# Patient Record
Sex: Female | Born: 1945 | Race: White | Hispanic: No | Marital: Married | Smoking: Current every day smoker
Health system: Southern US, Community
[De-identification: ages and names within clinical notes are randomized; demographics above are authoritative.]

---

## 2004-03-08 ENCOUNTER — Emergency Department (HOSPITAL_COMMUNITY): Admission: EM | Admit: 2004-03-08 | Discharge: 2004-03-08 | Payer: Self-pay | Admitting: Emergency Medicine

## 2015-08-07 ENCOUNTER — Encounter (HOSPITAL_COMMUNITY): Payer: Self-pay | Admitting: *Deleted

## 2015-08-07 ENCOUNTER — Emergency Department (HOSPITAL_COMMUNITY)
Admission: EM | Admit: 2015-08-07 | Discharge: 2015-08-07 | Disposition: A | Discharge status: Home / Self Care | Payer: PPO | Attending: Emergency Medicine | Admitting: Emergency Medicine

## 2015-08-07 ENCOUNTER — Emergency Department (HOSPITAL_COMMUNITY): Payer: PPO

## 2015-08-07 DIAGNOSIS — Z880 Allergy status to penicillin: Secondary | ICD-10-CM | POA: Diagnosis not present

## 2015-08-07 DIAGNOSIS — F17210 Nicotine dependence, cigarettes, uncomplicated: Secondary | ICD-10-CM | POA: Diagnosis not present

## 2015-08-07 DIAGNOSIS — J189 Pneumonia, unspecified organism: Secondary | ICD-10-CM | POA: Diagnosis not present

## 2015-08-07 DIAGNOSIS — J159 Unspecified bacterial pneumonia: Secondary | ICD-10-CM | POA: Diagnosis not present

## 2015-08-07 DIAGNOSIS — R0602 Shortness of breath: Secondary | ICD-10-CM | POA: Diagnosis not present

## 2015-08-07 DIAGNOSIS — R000 Tachycardia, unspecified: Secondary | ICD-10-CM | POA: Diagnosis not present

## 2015-08-07 DIAGNOSIS — F17200 Nicotine dependence, unspecified, uncomplicated: Secondary | ICD-10-CM | POA: Insufficient documentation

## 2015-08-07 LAB — CBC WITH DIFFERENTIAL/PLATELET
BASOS ABS: 0 10*3/uL (ref 0.0–0.1)
Basophils Relative: 0 %
EOS ABS: 0 10*3/uL (ref 0.0–0.7)
Eosinophils Relative: 0 %
HCT: 44.7 % (ref 36.0–46.0)
HEMOGLOBIN: 15.1 g/dL — AB (ref 12.0–15.0)
LYMPHS ABS: 2.8 10*3/uL (ref 0.7–4.0)
Lymphocytes Relative: 19 %
MCH: 30.1 pg (ref 26.0–34.0)
MCHC: 33.8 g/dL (ref 30.0–36.0)
MCV: 89.2 fL (ref 78.0–100.0)
Monocytes Absolute: 0.8 10*3/uL (ref 0.1–1.0)
Monocytes Relative: 5 %
NEUTROS PCT: 76 %
Neutro Abs: 11.5 10*3/uL — ABNORMAL HIGH (ref 1.7–7.7)
Platelets: 521 10*3/uL — ABNORMAL HIGH (ref 150–400)
RBC: 5.01 MIL/uL (ref 3.87–5.11)
RDW: 12.7 % (ref 11.5–15.5)
WBC: 15.1 10*3/uL — AB (ref 4.0–10.5)

## 2015-08-07 LAB — BASIC METABOLIC PANEL
ANION GAP: 10 (ref 5–15)
BUN: 11 mg/dL (ref 6–20)
CHLORIDE: 102 mmol/L (ref 101–111)
CO2: 29 mmol/L (ref 22–32)
Calcium: 9.1 mg/dL (ref 8.9–10.3)
Creatinine, Ser: 0.63 mg/dL (ref 0.44–1.00)
Glucose, Bld: 135 mg/dL — ABNORMAL HIGH (ref 65–99)
POTASSIUM: 3.7 mmol/L (ref 3.5–5.1)
SODIUM: 141 mmol/L (ref 135–145)

## 2015-08-07 MED ORDER — LEVOFLOXACIN 500 MG PO TABS
500.0000 mg | ORAL_TABLET | Freq: Once | ORAL | Status: AC
  Administered 2015-08-07: 500 mg via ORAL
  Filled 2015-08-07: qty 1

## 2015-08-07 MED ORDER — CHLORPHENIRAMINE-PHENYLEPHRINE 1-3.5 MG/ML PO LIQD: 0.7500 mL | Freq: Four times a day (QID) | ORAL | Status: DC | PRN

## 2015-08-07 MED ORDER — LEVOFLOXACIN 500 MG PO TABS
500.0000 mg | ORAL_TABLET | Freq: Every day | ORAL | Status: DC
Start: 2015-08-07 — End: 2015-11-14

## 2015-08-07 NOTE — ED Notes (Signed)
MD at bedside. 

## 2015-08-07 NOTE — ED Notes (Signed)
Pt with cough and congestion for about a week, went to see PCP at [facility] and they would not see her, denies fever or N/v/D

## 2015-08-07 NOTE — Discharge Instructions (Signed)
Community-Acquired Pneumonia, Adult Pneumonia is an infection of the lungs. One type of pneumonia can happen while a person is in a hospital. A different type can happen when a person is not in a hospital (community-acquired pneumonia). It is easy for this kind to spread from person to person. It can spread to you if you breathe near an infected person who coughs or sneezes. Some symptoms include:  A dry cough.  A wet (productive) cough.  Fever.  Sweating.  Chest pain. HOME CARE  Take over-the-counter and prescription medicines only as told by your doctor.  Only take cough medicine if you are losing sleep.  If you were prescribed an antibiotic medicine, take it as told by your doctor. Do not stop taking the antibiotic even if you start to feel better.  Sleep with your head and neck raised (elevated). You can do this by putting a few pillows under your head, or you can sleep in a recliner.  Do not use tobacco products. These include cigarettes, chewing tobacco, and e-cigarettes. If you need help quitting, ask your doctor.  Drink enough water to keep your pee (urine) clear or pale yellow. A shot (vaccine) can help prevent pneumonia. Shots are often suggested for:  People older than 71 years of age.  People older than 71 years of age:  Who are having cancer treatment.  Who have long-term (chronic) lung disease.  Who have problems with their body's defense system (immune system). You may also prevent pneumonia if you take these actions:  Get the flu (influenza) shot every year.  Go to the dentist as often as told.  Wash your hands often. If soap and water are not available, use hand sanitizer. GET HELP IF:  You have a fever.  You lose sleep because your cough medicine does not help. GET HELP RIGHT AWAY IF:  You are short of breath and it gets worse.  You have more chest pain.  Your sickness gets worse. This is very serious if:  You are an older adult.  Your  body's defense system is weak.  You cough up blood.   This information is not intended to replace advice given to you by your health care provider. Make sure you discuss any questions you have with your health care provider.   Document Released: 12/30/2007 Document Revised: 04/03/2015 Document Reviewed: 11/07/2014 Elsevier Interactive Patient Education 2016 [employer]   SMOKING CESSATION HELP:  Dont use tobacco products: Call [phone] for an appointment to help you quit using tobacco

## 2015-08-07 NOTE — ED Provider Notes (Signed)
CSN: [id number]     Arrival date & time 08/07/15  1343 History   First MD Initiated Contact with Patient 08/07/15 1401     Chief Complaint  Patient presents with  . Shortness of Breath     (Consider location/radiation/quality/duration/timing/severity/associated sxs/prior Treatment) HPI She presents with concern of cough, dyspnea, congestion. Patient has had intermittent neck discomfort as well, though currently no neck pain. Symptoms began about one week ago. No relief with Mucinex. Patient was well prior to the onset of symptoms, since onset symptoms have been progressive, with generalized fatigue, but no focal pain, no fever, chills, no nausea, vomiting, diarrhea. Patient smokes, denies other medical problems.   Smoking cessation provided, particularly in light of this patient's evaluation in the ED.  History reviewed. No pertinent past medical history. History reviewed. No pertinent past surgical history. History reviewed. No pertinent family history. Social History  Substance Use Topics  . Smoking status: Current Every Day Smoker    Types: Cigarettes  . Smokeless tobacco: None  . Alcohol Use: No   OB History    No data available     Review of Systems  Constitutional:       Per HPI, otherwise negative  HENT:       Per HPI, otherwise negative  Respiratory:       Per HPI, otherwise negative  Cardiovascular:       Per HPI, otherwise negative  Gastrointestinal: Negative for vomiting.  Endocrine:       Negative aside from HPI  Genitourinary:       Neg aside from HPI   Musculoskeletal:       Per HPI, otherwise negative  Skin: Negative.   Neurological: Negative for syncope.      Allergies  Penicillins  Home Medications   Prior to Admission medications   Not on File   BP 158/90 mmHg  Pulse 110  Temp(Src) 97.9 F (36.6 C) (Temporal)  Resp 18  Ht 5\' 3"  (1.6 m)  Wt 130 lb (58.968 kg)  BMI 23.03 kg/m2  SpO2 99% Physical Exam  Constitutional: She is  oriented to person, place, and time. She appears well-developed and well-nourished. No distress.  Obvious order of cigarette smoke  HENT:  Head: Normocephalic and atraumatic.  Eyes: Conjunctivae and EOM are normal.  Cardiovascular: Regular rhythm.  Tachycardia present.   Pulmonary/Chest: Effort normal. No stridor. No respiratory distress.  Diminished breath sounds, no wheezing  Abdominal: She exhibits no distension.  Musculoskeletal: She exhibits no edema.  Neurological: She is alert and oriented to person, place, and time. No cranial nerve deficit.  Skin: Skin is warm and dry.  Psychiatric: She has a normal mood and affect.  Nursing note and vitals reviewed.   ED Course  Procedures (including critical care time) Labs Review Labs Reviewed  CBC WITH DIFFERENTIAL/PLATELET - Abnormal; Notable for the following:    WBC 15.1 (*)    Hemoglobin 15.1 (*)    Platelets 521 (*)    Neutro Abs 11.5 (*)    All other components within normal limits  BASIC METABOLIC PANEL    Imaging Review Dg Chest 2 View  08/07/2015  CLINICAL DATA:  Shortness of breath and hoarseness beginning yesterday. Smoker. EXAM: CHEST  2 VIEW COMPARISON:  None. FINDINGS: Mild airspace disease is seen in the posterior left lower lobe, suspicious for pneumonia. Right lung is clear. No evidence of pneumothorax or pleural effusion. Heart size and mediastinal contours are within normal limits. Pulmonary hyperinflation is suspicious for  COPD. IMPRESSION: Mild posterior left lower lobe airspace disease, suspicious for pneumonia. Followup PA and lateral chest X-ray is recommended in 3-4 weeks following trial of antibiotic therapy to ensure resolution and exclude underlying malignancy. Probable COPD. Electronically Signed   By: Myles RosenthalJohn  Stahl M.D.   On: 08/07/2015 14:21   I have personally reviewed and evaluated these images and lab results as part of my medical decision-making.  On repeat exam the patient is in no distress.  We  discussed all findings at length, including presumptive pneumonia, the need to follow-up with primary care.  MDM  Elderly smoker presents with cough, congestion, here is tachycardic, otherwise hemodynamically stable, awake, alert, in no distress. Patient started on a course of antibiotics, discharged in stable condition to follow-up with primary care.  Gerhard Munchobert Sisto Granillo, MD 08/07/15 [phone]

## 2015-08-15 ENCOUNTER — Emergency Department (HOSPITAL_COMMUNITY)
Admission: EM | Admit: 2015-08-15 | Discharge: 2015-08-15 | Disposition: A | Discharge status: Home / Self Care | Payer: PPO | Attending: Emergency Medicine | Admitting: Emergency Medicine

## 2015-08-15 ENCOUNTER — Encounter (HOSPITAL_COMMUNITY): Payer: Self-pay | Admitting: *Deleted

## 2015-08-15 DIAGNOSIS — J159 Unspecified bacterial pneumonia: Secondary | ICD-10-CM | POA: Diagnosis not present

## 2015-08-15 DIAGNOSIS — Z09 Encounter for follow-up examination after completed treatment for conditions other than malignant neoplasm: Secondary | ICD-10-CM | POA: Diagnosis not present

## 2015-08-15 DIAGNOSIS — Z880 Allergy status to penicillin: Secondary | ICD-10-CM | POA: Insufficient documentation

## 2015-08-15 DIAGNOSIS — F17210 Nicotine dependence, cigarettes, uncomplicated: Secondary | ICD-10-CM | POA: Insufficient documentation

## 2015-08-15 DIAGNOSIS — R000 Tachycardia, unspecified: Secondary | ICD-10-CM | POA: Diagnosis not present

## 2015-08-15 DIAGNOSIS — Z792 Long term (current) use of antibiotics: Secondary | ICD-10-CM | POA: Diagnosis not present

## 2015-08-15 DIAGNOSIS — J189 Pneumonia, unspecified organism: Secondary | ICD-10-CM | POA: Diagnosis not present

## 2015-08-15 DIAGNOSIS — R05 Cough: Secondary | ICD-10-CM | POA: Diagnosis not present

## 2015-08-15 NOTE — Discharge Instructions (Signed)
Continue Mucinex.  Continue and complete your current course of Levaquin.  Stop taking over-the-counter antihistamines, this may be contributing to your rapid heart rate.  Recheck here with any failure to improve or worsening symptoms.

## 2015-08-15 NOTE — ED Provider Notes (Signed)
CSN: [id number]     Arrival date & time 08/15/15  1340 History   First MD Initiated Contact with Patient 08/15/15 1358     Chief Complaint  Patient presents with  . Follow-up      HPI  Patient presents for evaluation of "just getting rechecked for this pneumonia".  Seen and evaluated 8 days ago. Diagnosed with community-acquired pneumonia. Had a leukocytosis. Had a mild resting tachycardia and infiltrate noted on chest x-ray. Did not have chest pain. Had had fevers shakes and chills and a productive cough. Discharge. Was compliant with her instructions. States he feels considerably better. Try to get back into her primary care physician that she had seen years ago for a recheck and they would not see her because she was not a current patient. She states she became "really nervous" and wanted to "get checked to make sure everything was okay".  States she is taking Mucinex, has 3 days left of her 10 day course of Levaquin, as well as an antihistamine "that starts with C". States she was producing more sputum for a short while. It is tapered off. She states she feels much improved. She has resumed walking. Normally walks 2 miles per day. No leg swelling or edema. No chest discomfort.     History reviewed. No pertinent past medical history. History reviewed. No pertinent past surgical history. History reviewed. No pertinent family history. Social History  Substance Use Topics  . Smoking status: Current Every Day Smoker    Types: Cigarettes  . Smokeless tobacco: None  . Alcohol Use: No   OB History    No data available     Review of Systems  Constitutional: Negative for fever, chills, diaphoresis, appetite change and fatigue.  HENT: Negative for mouth sores, sore throat and trouble swallowing.   Eyes: Negative for visual disturbance.  Respiratory: Positive for cough. Negative for chest tightness, shortness of breath and wheezing.   Cardiovascular: Negative for chest pain.   Gastrointestinal: Negative for nausea, vomiting, abdominal pain, diarrhea and abdominal distention.  Endocrine: Negative for polydipsia, polyphagia and polyuria.  Genitourinary: Negative for dysuria, frequency and hematuria.  Musculoskeletal: Negative for gait problem.  Skin: Negative for color change, pallor and rash.  Neurological: Negative for dizziness, syncope, light-headedness and headaches.  Hematological: Does not bruise/bleed easily.  Psychiatric/Behavioral: Negative for behavioral problems and confusion.      Allergies  Penicillins  Home Medications   Prior to Admission medications   Medication Sig Start Date End Date Taking? Authorizing Provider  chlorpheniramine-phenylephrine (CARDEC) 1-3.5 MG/ML LIQD Take 0.75 mLs by mouth 4 (four) times daily as needed. Patient taking differently: Take 0.75 mLs by mouth 4 (four) times daily as needed (cough).  08/07/15  Yes Gerhard Munch, MD  levofloxacin (LEVAQUIN) 500 MG tablet Take 1 tablet (500 mg total) by mouth daily. 08/07/15  Yes Gerhard Munch, MD   BP 161/90 mmHg  Pulse 119  Temp(Src) 98.2 F (36.8 C) (Oral)  Resp 18  Ht  (1.6 m)  Wt 130 lb (58.968 kg)  BMI 23.03 kg/m2  SpO2 99% Physical Exam  Constitutional: She is oriented to person, place, and time. She appears well-developed and well-nourished. No distress.  HENT:  Head: Normocephalic.  Eyes: Conjunctivae are normal. Pupils are equal, round, and reactive to light. No scleral icterus.  Neck: Normal range of motion. Neck supple. No thyromegaly present.  Cardiovascular: Regular rhythm.  Tachycardia present.  Exam reveals no gallop and no friction rub.   No  murmur heard. Pulmonary/Chest: Effort normal and breath sounds normal. No respiratory distress. She has no wheezes. She has no rales.  Clear bilateral breath sounds. No focal diminished breath sounds. No increased work of breathing.  Abdominal: Soft. Bowel sounds are normal. She exhibits no distension.  There is no tenderness. There is no rebound.  Musculoskeletal: Normal range of motion.  Neurological: She is alert and oriented to person, place, and time.  Skin: Skin is warm and dry. No rash noted.  Psychiatric: She has a normal mood and affect. Her behavior is normal.    ED Course  Procedures (including critical care time) Labs Review Labs Reviewed - No data to display  Imaging Review No results found. I have personally reviewed and evaluated these images and lab results as part of my medical decision-making.   EKG Interpretation None      MDM   Final diagnoses:  Community acquired pneumonia    Patient is essentially here for routine reevaluation because she has no primary care physician. Overall she states she feels "much much better" than last week and diagnosed with pneumonia. She is saturating 99% on room air. Heart rate initially at triage 119. His examiner in the room initially it is 110. She states that she is "very nervous". Has we talk her rate does improve and recheck is 101. She ambulates to the bathroom and back and it remains 101-105. She does not have pain. She has normal exam of her heart and lungs of than the tachycardia. She has no risk factors for PE, other than smoking.  I do not feel that she needs further evaluation. She has improved with therapy for her community acquired pneumonia.  Referred to local primary care. She has additional 3 days of Levaquin. She has been taking an over-the-counter antihistamine. I asked her to discontinue this. She states that she spoke with the pharmacist about her Mucinex was told not to take the Mucinex-D "because she was already taking a decongestant".   Rolland Porter, MD 08/15/15 1432

## 2015-08-15 NOTE — ED Notes (Signed)
Pt here for follow up since seen last week.  Pt states that she is unable to find a doctor to see her

## 2015-11-14 ENCOUNTER — Emergency Department (HOSPITAL_COMMUNITY): Payer: PPO

## 2015-11-14 ENCOUNTER — Emergency Department (HOSPITAL_COMMUNITY)
Admission: EM | Admit: 2015-11-14 | Discharge: 2015-11-14 | Disposition: A | Discharge status: Home / Self Care | Payer: PPO | Attending: Emergency Medicine | Admitting: Emergency Medicine

## 2015-11-14 DIAGNOSIS — Y9301 Activity, walking, marching and hiking: Secondary | ICD-10-CM | POA: Insufficient documentation

## 2015-11-14 DIAGNOSIS — S92101A Unspecified fracture of right talus, initial encounter for closed fracture: Secondary | ICD-10-CM | POA: Insufficient documentation

## 2015-11-14 DIAGNOSIS — Y999 Unspecified external cause status: Secondary | ICD-10-CM | POA: Insufficient documentation

## 2015-11-14 DIAGNOSIS — S99921A Unspecified injury of right foot, initial encounter: Secondary | ICD-10-CM | POA: Diagnosis not present

## 2015-11-14 DIAGNOSIS — S93601A Unspecified sprain of right foot, initial encounter: Secondary | ICD-10-CM | POA: Diagnosis not present

## 2015-11-14 DIAGNOSIS — T148XXA Other injury of unspecified body region, initial encounter: Secondary | ICD-10-CM

## 2015-11-14 DIAGNOSIS — M7989 Other specified soft tissue disorders: Secondary | ICD-10-CM | POA: Diagnosis not present

## 2015-11-14 DIAGNOSIS — W109XXA Fall (on) (from) unspecified stairs and steps, initial encounter: Secondary | ICD-10-CM | POA: Diagnosis not present

## 2015-11-14 DIAGNOSIS — F17210 Nicotine dependence, cigarettes, uncomplicated: Secondary | ICD-10-CM | POA: Insufficient documentation

## 2015-11-14 DIAGNOSIS — Y92009 Unspecified place in unspecified non-institutional (private) residence as the place of occurrence of the external cause: Secondary | ICD-10-CM | POA: Diagnosis not present

## 2015-11-14 DIAGNOSIS — S92141A Displaced dome fracture of right talus, initial encounter for closed fracture: Secondary | ICD-10-CM | POA: Diagnosis not present

## 2015-11-14 NOTE — ED Provider Notes (Signed)
CSN: [id number]     Arrival date & time 11/14/15  1900 History   First MD Initiated Contact with Patient 11/14/15 1941     Chief Complaint  Patient presents with  . Fall      HPI  Patient presents complaining of foot pain. She fell down several steps and hurt her right foot has pain bruising and swelling of dorsum of her right foot.  No past medical history on file. No past surgical history on file. No family history on file. Social History  Substance Use Topics  . Smoking status: Current Every Day Smoker    Types: Cigarettes  . Smokeless tobacco: Not on file  . Alcohol Use: No   OB History    No data available     Review of Systems  Constitutional: Negative for fever, chills, diaphoresis, appetite change and fatigue.  HENT: Negative for mouth sores, sore throat and trouble swallowing.   Eyes: Negative for visual disturbance.  Respiratory: Negative for cough, chest tightness, shortness of breath and wheezing.   Cardiovascular: Negative for chest pain.  Gastrointestinal: Negative for nausea, vomiting, abdominal pain, diarrhea and abdominal distention.  Endocrine: Negative for polydipsia, polyphagia and polyuria.  Genitourinary: Negative for dysuria, frequency and hematuria.  Musculoskeletal: Negative for gait problem.       Bruising, soft tissue swelling, and pain to the dorsum of the right foot.  Skin: Negative for color change, pallor and rash.  Neurological: Negative for dizziness, syncope, light-headedness and headaches.  Hematological: Does not bruise/bleed easily.  Psychiatric/Behavioral: Negative for behavioral problems and confusion.      Allergies  Penicillins  Home Medications   Prior to Admission medications   Not on File   BP 197/92 mmHg  Pulse 120  Temp(Src) 98.1 F (36.7 C) (Oral)  Resp 24  Ht  (1.6 m)  Wt 123 lb (55.792 kg)  BMI 21.79 kg/m2  SpO2 99% Physical Exam  Constitutional: She is oriented to person, place, and time. She  appears well-developed and well-nourished. No distress.  HENT:  Head: Normocephalic.  Eyes: Conjunctivae are normal. Pupils are equal, round, and reactive to light. No scleral icterus.  Neck: Normal range of motion. Neck supple. No thyromegaly present.  Cardiovascular: Normal rate and regular rhythm.  Exam reveals no gallop and no friction rub.   No murmur heard. Pulmonary/Chest: Effort normal and breath sounds normal. No respiratory distress. She has no wheezes. She has no rales.  Abdominal: Soft. Bowel sounds are normal. She exhibits no distension. There is no tenderness. There is no rebound.  Musculoskeletal: Normal range of motion.       Feet:  Neurological: She is alert and oriented to person, place, and time.  Skin: Skin is warm and dry. No rash noted.  Psychiatric: She has a normal mood and affect. Her behavior is normal.    ED Course  Procedures (including critical care time) Labs Review Labs Reviewed - No data to display  Imaging Review Dg Ankle Complete Right  11/14/2015  CLINICAL DATA:  Larey Seat down steps at 1815 hours tonight, pain and swelling lateral RIGHT ankle 1 lateral RIGHT foot, bruising and swelling across tarsals and metatarsals RIGHT foot, initial encounter EXAM: RIGHT ANKLE - COMPLETE 3+ VIEW COMPARISON:  03/08/2004 FINDINGS: Osseous demineralization. Ankle mortise intact. Small ossified body at lateral margin of the talus, not definitely seen on previous exam, consistent with age-indeterminate fracture fragment at site of talofibular ligament insertion. Minimal soft tissue swelling at site. Questionable underlying cortical discontinuity  of the lateral talar margin. No additional fracture, dislocation or bone destruction. IMPRESSION: Age-indeterminate avulsion fracture at lateral margin of talus at talofibular ligament insertion ; correlation for pain/tenderness at this site recommended. No additional acute abnormalities. Electronically Signed   By: Ulyses SouthwardMark  Boles M.D.   On:  11/14/2015 20:30   Dg Foot Complete Right  11/14/2015  CLINICAL DATA:  Larey SeatFell down steps at 1815 hours tonight, pain and swelling lateral RIGHT ankle 1 lateral RIGHT foot, bruising and swelling across tarsals and metatarsals RIGHT foot, initial encounter EXAM: RIGHT FOOT COMPLETE - 3+ VIEW COMPARISON:  03/08/2004 FINDINGS: Osseous demineralization. Degenerative changes first MTP joint with joint space narrowing and spur formation. Additional degenerative changes at talonavicular joint. Joint spaces otherwise preserved. No acute fracture, dislocation or bone destruction. Plantar calcaneal spur. Mild dorsal soft tissue swelling at the midfoot. 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 Osseous demineralization without acute bony abnormalities. Electronically Signed   By: Ulyses SouthwardMark  Boles M.D.   On: 11/14/2015 20:27   I have personally reviewed and evaluated these images and lab results as part of my medical decision-making.   EKG Interpretation None      MDM   Final diagnoses:  Foot sprain, right, initial encounter  Avulsion fracture    X-ray show avulsion on the dorsum of the talus and the area of the tailofibular  ligament. Likely avulsion secondary to her hyper plantar flexion injury.  Placed in an Ace wrap. Patient has a walking boot at home. We will utilize a boot and follow-up with orthopedics. Ice and elevate whenever possible. Declines pain medication here.    Rolland PorterMark Kurstin Dimarzo, MD 11/14/15 [phone]

## 2015-11-14 NOTE — Discharge Instructions (Signed)
Minimize weightbearing.  Wear your pain when weightbearing.  Call Dr. Romeo AppleHarrison to arrange follow-up appointment.

## 2015-11-14 NOTE — ED Notes (Addendum)
Fell down 5 steps and hurt my right foot and ankle.  Pain, bruising, and swelling noted to right foot.  No other injury. Was running out the door and fell. No dizziness or loss of consciousness.

## 2015-11-14 NOTE — ED Notes (Signed)
Ace wrap applied to right foot.

## 2015-11-21 ENCOUNTER — Ambulatory Visit (INDEPENDENT_AMBULATORY_CARE_PROVIDER_SITE_OTHER): Payer: PPO | Admitting: Orthopedic Surgery

## 2015-11-21 VITALS — BP 192/88 | HR 121 | Ht 63.0 in | Wt 123.0 lb

## 2015-11-21 DIAGNOSIS — S82891A Other fracture of right lower leg, initial encounter for closed fracture: Secondary | ICD-10-CM | POA: Diagnosis not present

## 2015-11-21 DIAGNOSIS — S93401A Sprain of unspecified ligament of right ankle, initial encounter: Secondary | ICD-10-CM

## 2015-11-21 NOTE — Progress Notes (Signed)
Chief Complaint  Patient presents with  . Follow-up    [facility] right foot fracture 11/14/15   HPI - 70 year old female fell down some stairs on the 20th she went to the ER today x-ray showed an avulsion fracture from the lateral side of the foot and ankle and an ankle sprain. She was placed in a Cam Walker which she has had difficulty wearing she would like another device  She has a dull aching pain over the lateral ankle on the right the pain is moderate duration one week timing constant worse with weightbearing  Review of Systems  Constitutional: Negative for fever and chills.  Musculoskeletal: Positive for falls.  Neurological: Negative for tingling.    Medical history she denies hypertension or diabetes  Surgical history the patient reports that she has not had any surgery  Social History  Substance Use Topics  . Smoking status: Current Every Day Smoker    Types: Cigarettes  . Smokeless tobacco: Not on file  . Alcohol Use: No   No current outpatient prescriptions on file.  BP 192/88 mmHg  Pulse 121  Ht 5\' 3"  (1.6 m)  Wt 123 lb (55.792 kg)  BMI 21.79 kg/m2  Physical Exam  Constitutional: She is oriented to person, place, and time. She appears well-developed and well-nourished. No distress.  Cardiovascular: Normal rate and intact distal pulses.   Neurological: She is alert and oriented to person, place, and time. She has normal reflexes. She exhibits normal muscle tone. Coordination normal.  Skin: Skin is warm and dry. No rash noted. She is not diaphoretic. No erythema. No pallor.  Psychiatric: She has a normal mood and affect. Her behavior is normal. Judgment and thought content normal.    Ortho Exam She is limping around with one crutch in a Cam Walker  The left foot for comparison looks normal no swelling no tenderness to skin is intact pulses are good sensation is normal and there is no atrophy  The right foot is extremely swollen is red and tender  on the lateral malleolus and the anterior talofibular ligament, the medial malleolus is nontender. The lateral metatarsal fifth metatarsal is nontender. She has a lot of swelling. She has neutral plantigrade foot with 10 plantar flexion. No instability of the ankle.    ASSESSMENT: My personal interpretation of the images:  I see x-rays of the ankle shows an avulsion fracture from the tip of the fibula or the anterior talofibular ligament and she also has foot x-rays and I see no fracture there she has some midfoot arthritis  Diagnosis ankle sprain with avulsion fracture    PLAN I placed her in an Aircast you did not tolerate Cam Walker she can weight-bear as tolerated but is advised to weight-bear minimally  Follow-up 4 weeks no x-ray needed exam only

## 2015-12-19 ENCOUNTER — Ambulatory Visit: Payer: PPO | Admitting: Orthopedic Surgery

## 2017-01-03 IMAGING — DX DG FOOT COMPLETE 3+V*R*
3 series · 3 of 3 positions shown · non-contrast
Comparison: 03/08/2004

CLINICAL DATA: Fell down steps at 0202 hours tonight, pain and
swelling lateral RIGHT ankle 1 lateral RIGHT foot, bruising and
swelling across tarsals and metatarsals RIGHT foot, initial
encounter

EXAM:
RIGHT FOOT COMPLETE - 3+ VIEW

[foot ap]
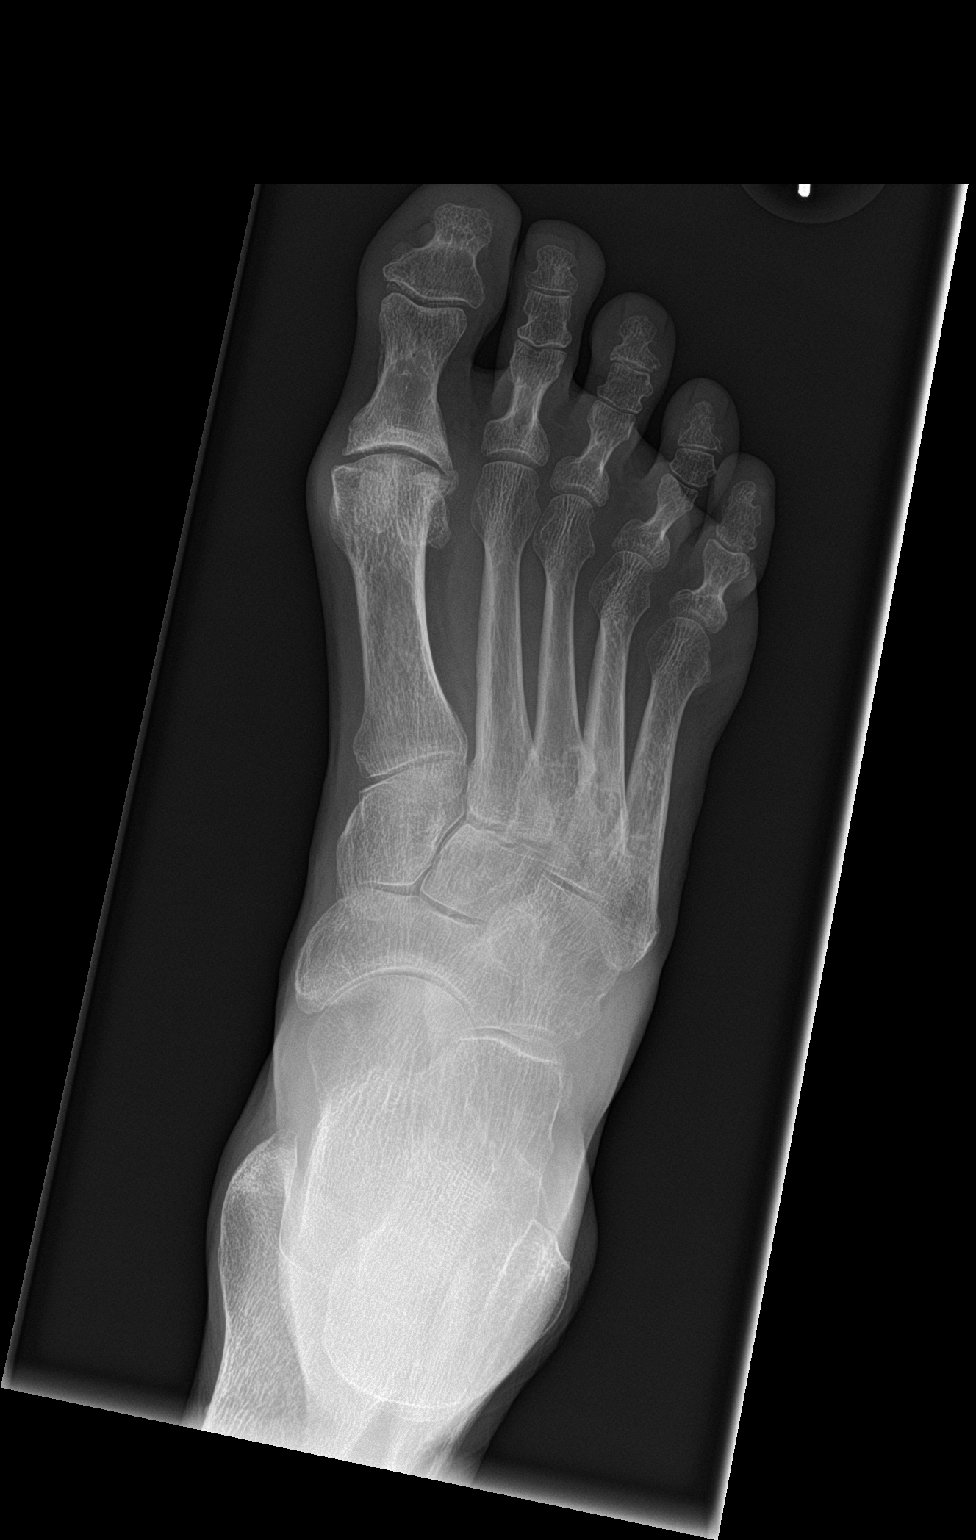

[foot obl]
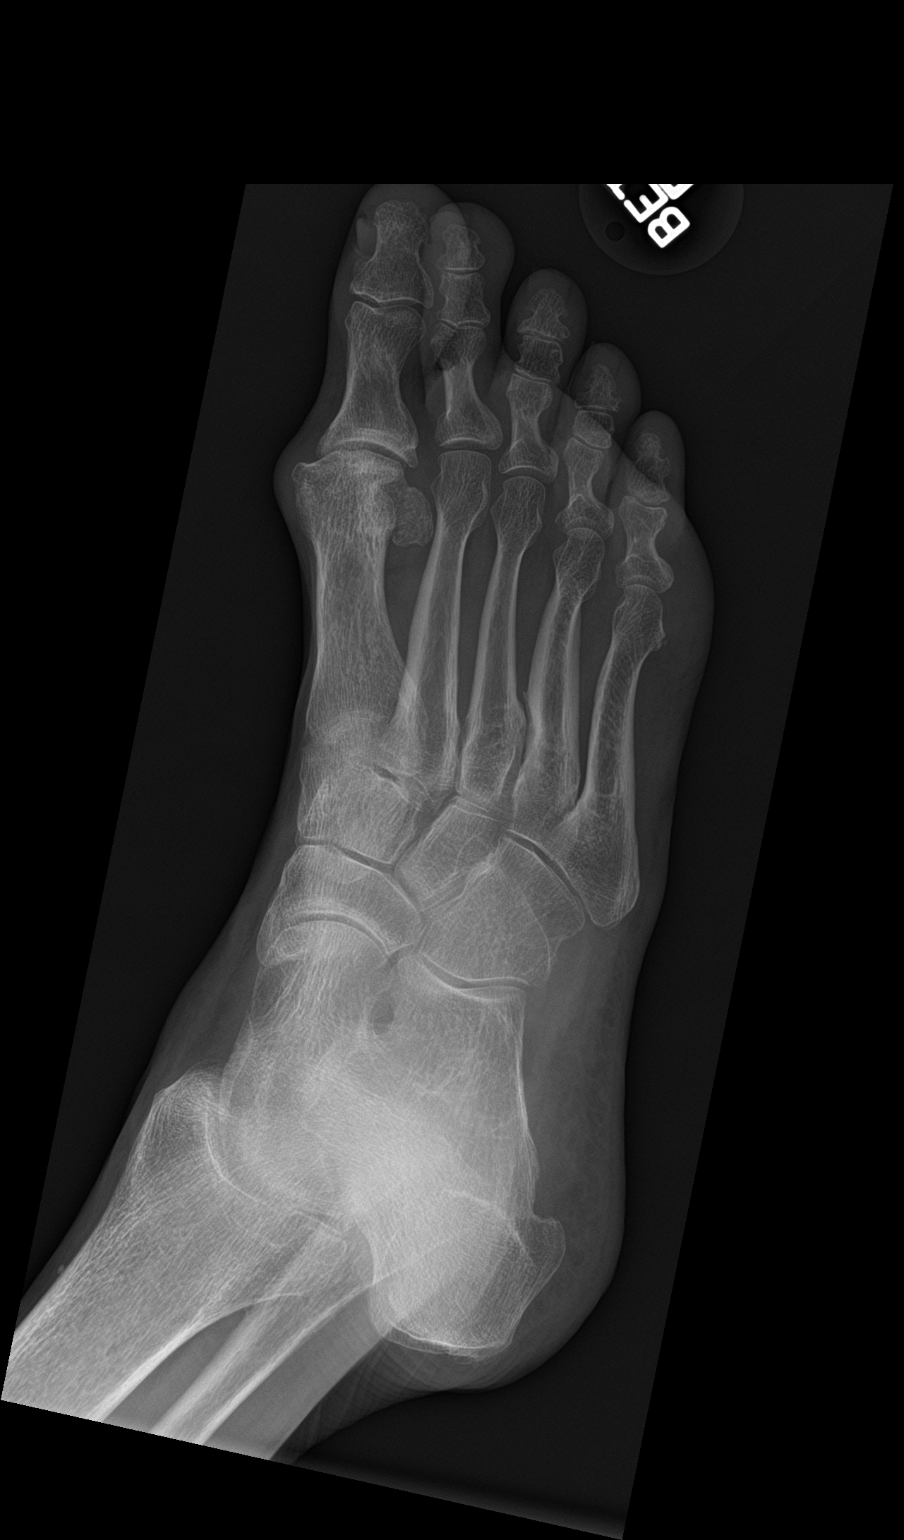

[foot lat]
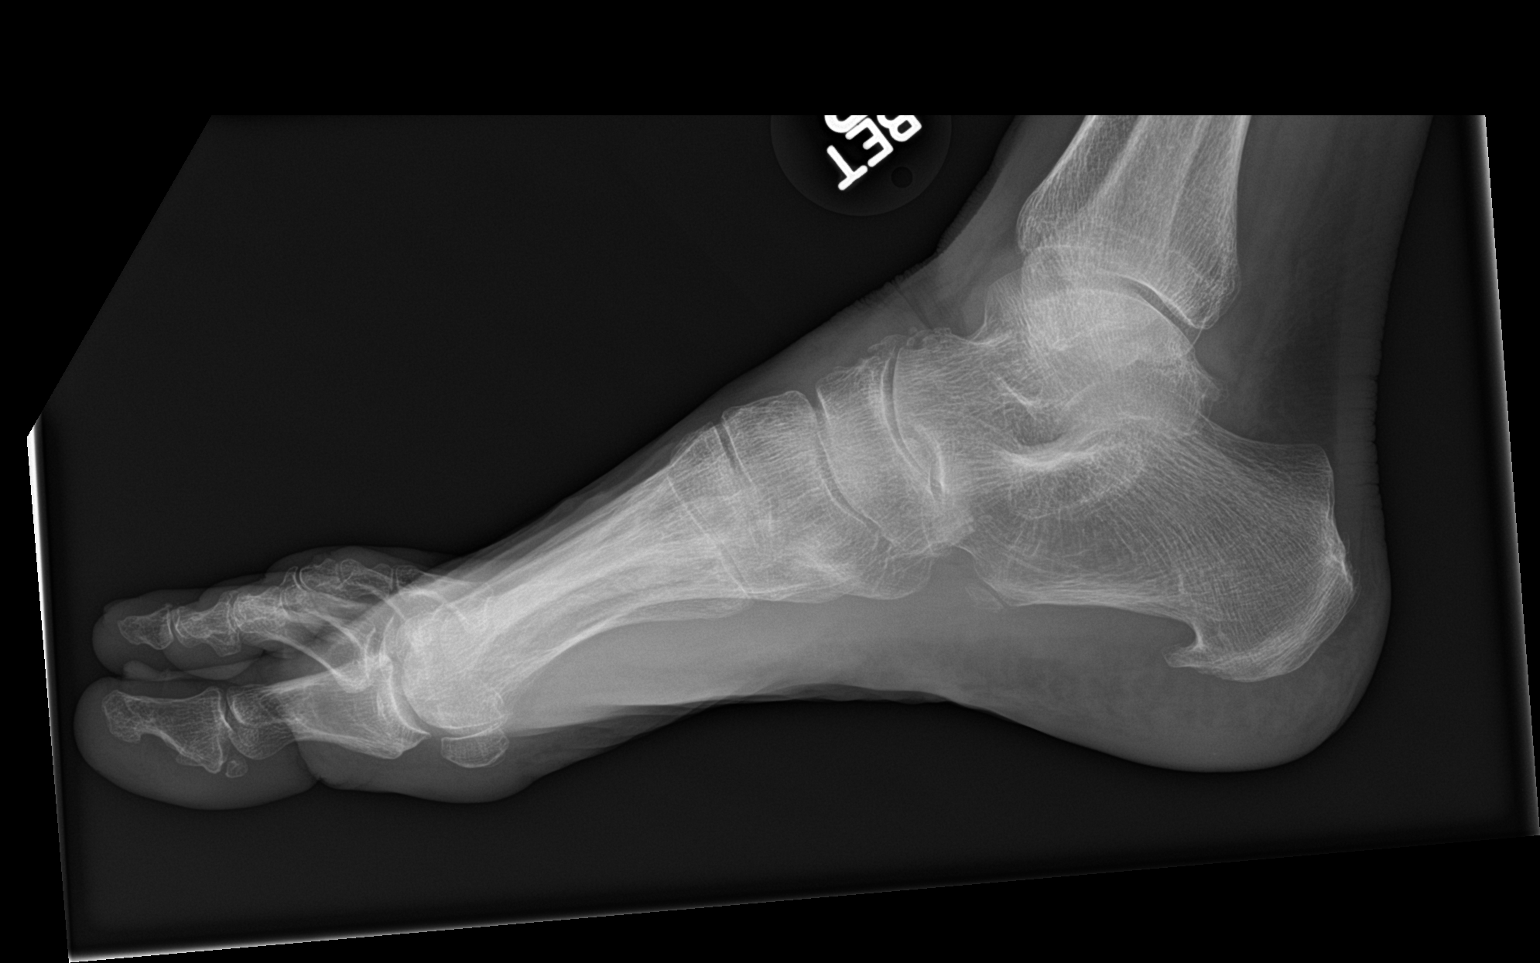

[3 of 3 positions shown; findings below may reference images not displayed]

FINDINGS: Osseous demineralization.

Degenerative changes first MTP joint with joint space narrowing and
spur formation.

Additional degenerative changes at talonavicular joint.

Joint spaces otherwise preserved.

No acute fracture, dislocation or bone destruction.

Plantar calcaneal spur.

Mild dorsal soft tissue swelling at the midfoot.
IMPRESSION: Degenerative changes first MTP joint and talonavicular joint.

Osseous demineralization without acute bony abnormalities.

## 2019-02-27 ENCOUNTER — Other Ambulatory Visit: Payer: Self-pay

## 2019-06-21 ENCOUNTER — Other Ambulatory Visit: Payer: Self-pay

## 2020-02-20 ENCOUNTER — Other Ambulatory Visit: Payer: Self-pay
